# Patient Record
Sex: Male | Born: 1980 | Race: White | Hispanic: No | Marital: Single | Smoking: Former smoker
Health system: Southern US, Community
[De-identification: ages and names within clinical notes are randomized; demographics above are authoritative.]

---

## 2001-04-01 ENCOUNTER — Emergency Department (HOSPITAL_COMMUNITY): Admission: EM | Admit: 2001-04-01 | Discharge: 2001-04-01 | Payer: Self-pay | Admitting: Emergency Medicine

## 2004-06-30 ENCOUNTER — Inpatient Hospital Stay (HOSPITAL_COMMUNITY): Admission: EM | Admit: 2004-06-30 | Discharge: 2004-07-02 | Payer: Self-pay

## 2005-09-05 IMAGING — CR DG CHEST 2V
2 series · 2 of 2 positions shown · non-contrast
Comparison: none

Clinical data at 23-year-old with altered mental status

2 view chest
Comparison 07/01/2004. Lungs are mildly hyperinflated. The cardiomediastinal silhouette is within
normal limits. Lungs are free of focal consolidations and pleural effusions.

[view not recorded (1 of 2)]
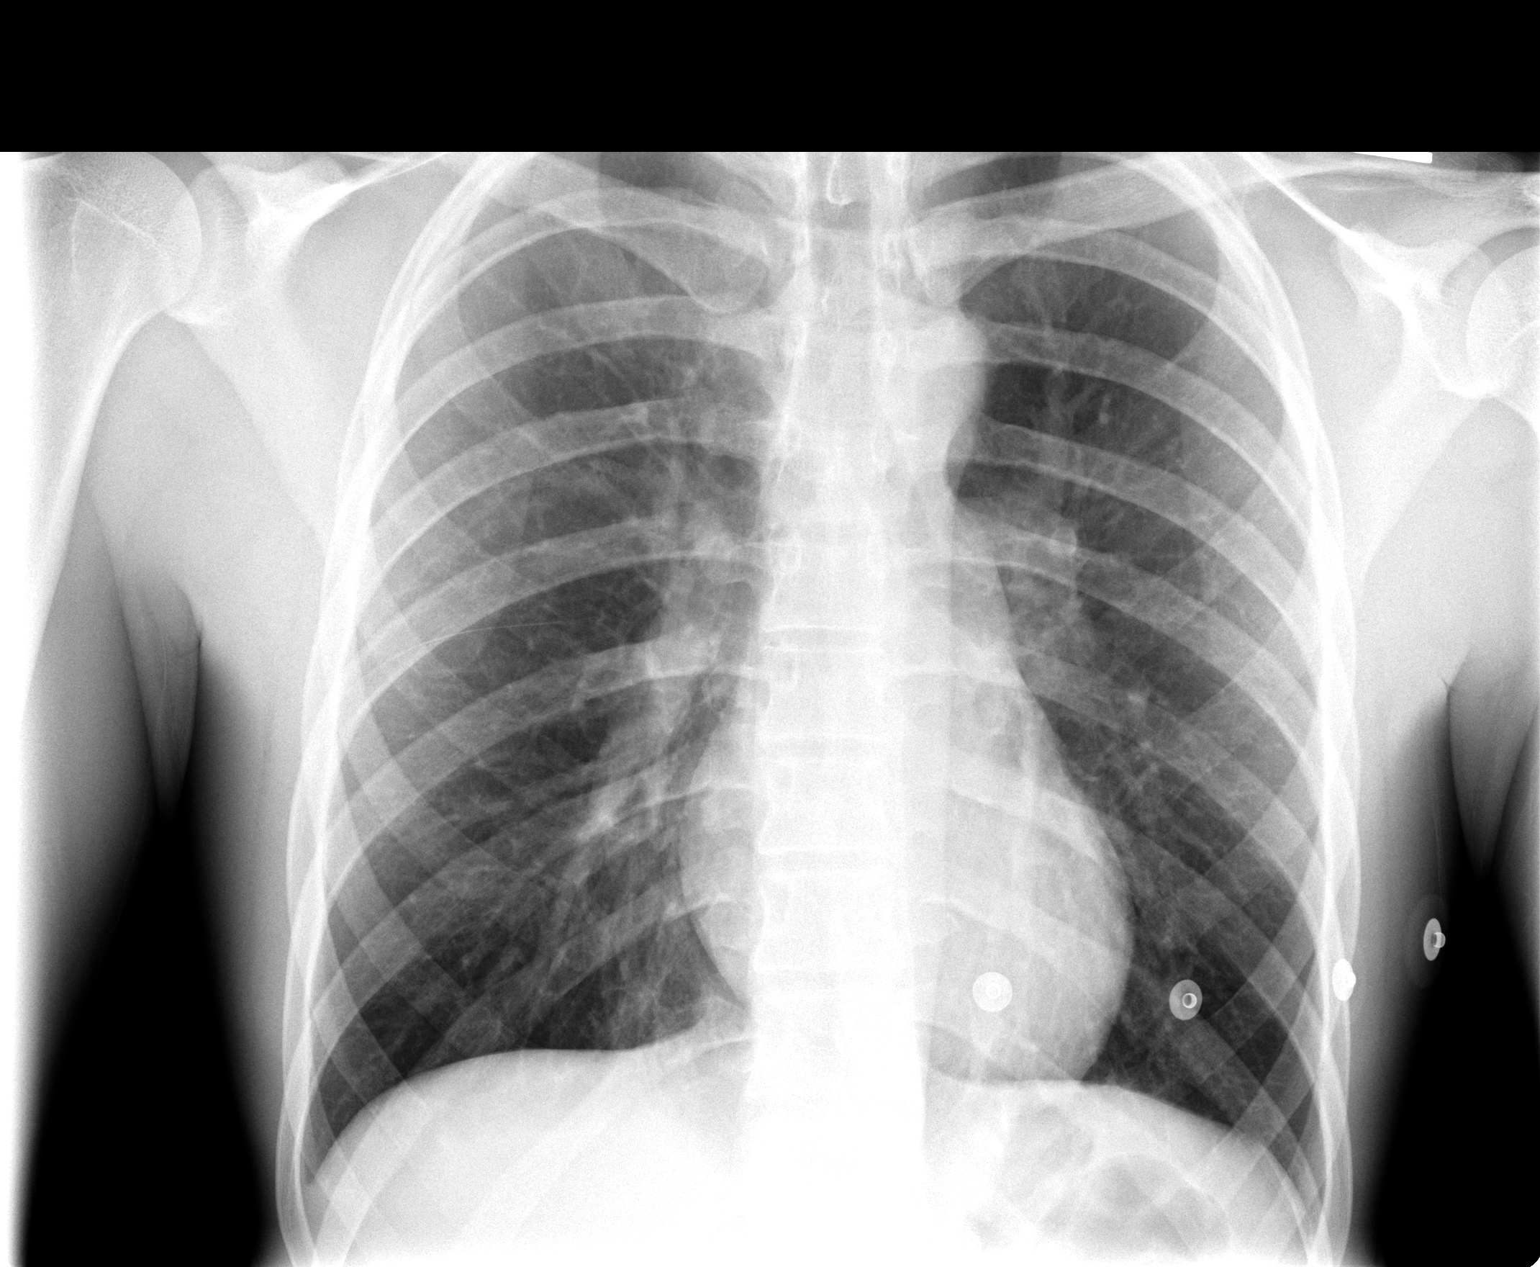

[view not recorded (2 of 2)]
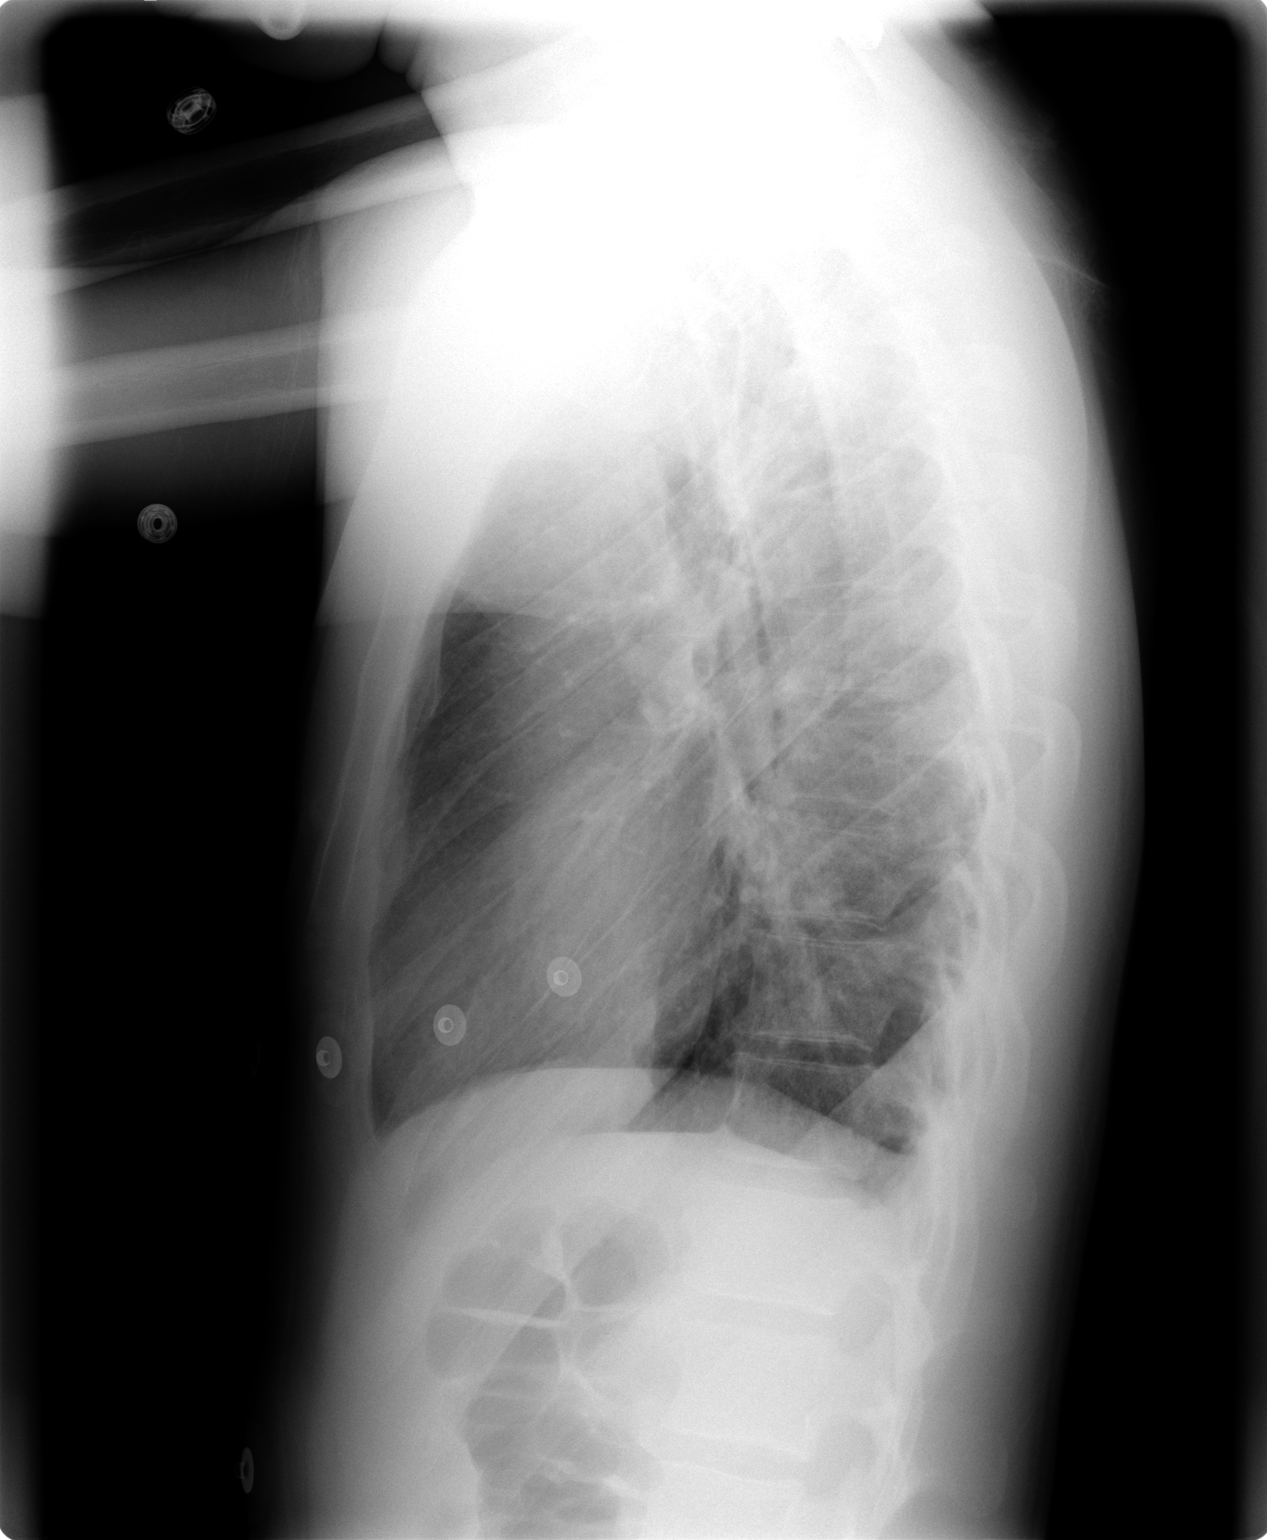

[2 of 2 positions shown; findings below may reference images not displayed]

IMPRESSION: No evidence for acute cardiopulmonary disease.

## 2006-01-19 ENCOUNTER — Emergency Department (HOSPITAL_COMMUNITY): Admission: EM | Admit: 2006-01-19 | Discharge: 2006-01-19 | Payer: Self-pay | Admitting: Emergency Medicine

## 2007-11-26 ENCOUNTER — Emergency Department (HOSPITAL_COMMUNITY): Admission: EM | Admit: 2007-11-26 | Discharge: 2007-11-26 | Payer: Self-pay | Admitting: Emergency Medicine
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
## 2011-06-28 LAB — GC/CHLAMYDIA PROBE AMP, GENITAL: Chlamydia, DNA Probe: NEGATIVE

## 2017-02-26 ENCOUNTER — Ambulatory Visit
Admission: EM | Admit: 2017-02-26 | Discharge: 2017-02-26 | Disposition: A | Discharge status: Home / Self Care | Payer: 59 | Attending: Family Medicine | Admitting: Family Medicine

## 2017-02-26 ENCOUNTER — Encounter: Payer: Self-pay | Admitting: *Deleted

## 2017-02-26 DIAGNOSIS — R300 Dysuria: Secondary | ICD-10-CM | POA: Diagnosis not present

## 2017-02-26 DIAGNOSIS — M545 Low back pain, unspecified: Secondary | ICD-10-CM

## 2017-02-26 LAB — URINALYSIS, COMPLETE (UACMP) WITH MICROSCOPIC
Bilirubin Urine: NEGATIVE
GLUCOSE, UA: 100 mg/dL — AB
Hgb urine dipstick: NEGATIVE
NITRITE: NEGATIVE
PH: 7 (ref 5.0–8.0)
Protein, ur: NEGATIVE mg/dL
Specific Gravity, Urine: 1.02 (ref 1.005–1.030)
Squamous Epithelial / LPF: NONE SEEN

## 2017-02-26 LAB — CHLAMYDIA/NGC RT PCR (ARMC ONLY)
Chlamydia Tr: NOT DETECTED
N GONORRHOEAE: NOT DETECTED

## 2017-02-26 MED ORDER — CYCLOBENZAPRINE HCL 10 MG PO TABS: 10.0000 mg | ORAL_TABLET | Freq: Two times a day (BID) | ORAL | 0 refills | Status: DC | PRN

## 2017-02-26 MED ORDER — MELOXICAM 15 MG PO TABS: 15.0000 mg | ORAL_TABLET | Freq: Every day | ORAL | 0 refills | Status: DC | PRN

## 2017-02-26 MED ORDER — SULFAMETHOXAZOLE-TRIMETHOPRIM 800-160 MG PO TABS: 1.0000 | ORAL_TABLET | Freq: Two times a day (BID) | ORAL | 0 refills | Status: AC

## 2017-02-26 NOTE — ED Triage Notes (Signed)
Low left back pain, onset while weight lifting 2 weeks ago. Has been using Aleve with partial relief.

## 2017-02-26 NOTE — ED Provider Notes (Signed)
[facility] ____________________________________________  Time seen: Approximately 5:25 PM  I have reviewed the triage vital signs and the nursing notes.   HISTORY  Chief Complaint Back Pain   HPI Clinton Brown is a 36 y.o. male present for evaluation of left lower back pain that is in present for the last 2 weeks. Patient reports pain onset was during sit-ups. Patient reports that he frequently works out and lifts weight. Patient states after he worked at [employer] sit ups and had onset of the same pain. Patient reports pain has been the same since onset. Patient reports pain is not constant in the last few days and sometimes goes away. Patient does report pain is worse with movement but at times present at rest. Denies any pain radiation, paresthesias or other injury. Denies any fall or direct trauma. Patient reports that day after pain he had one episode of some discomfort with urination but not a definite burning. Denies any urinary flow changes or hasn't. Patient reports after that episode it went away and has not reoccurred.  States pain is mild at this time. denies any penile or testicular pain or discharge or swelling or rash. Denies any concerns of STDs. Patient reports sexually active with 1 partner. Patient reports he is continue to remain active and working out. Denies history of chronic back pain. Denies any history of kidney stones or renal issues. Again denies direct trauma. Patient reports he has been taking over-the-counter ibuprofen which does help and intermittently does fully resolve pain.  Denies chest pain, shortness of breath, abdominal pain, extremity pain, extremity swelling or rash. Denies recent sickness. Denies recent antibiotic use.    History reviewed. No pertinent past medical history.  There are no active problems to display for this patient.   History reviewed. No pertinent surgical history.   No current facility-administered medications for  this encounter.   Current Outpatient Prescriptions:  .  cyclobenzaprine (FLEXERIL) 10 MG tablet, Take 1 tablet (10 mg total) by mouth 2 (two) times daily as needed for muscle spasms. Do not drive as can cause drowsiness, Disp: 20 tablet, Rfl: 0 .  meloxicam (MOBIC) 15 MG tablet, Take 1 tablet (15 mg total) by mouth daily as needed., Disp: 10 tablet, Rfl: 0 .  sulfamethoxazole-trimethoprim (BACTRIM DS,SEPTRA DS) 800-160 MG tablet, Take 1 tablet by mouth 2 (two) times daily., Disp: 6 tablet, Rfl: 0  Allergies Patient has no known allergies.  History reviewed. No pertinent family history.  Social History Social History  Substance Use Topics  . Smoking status: Former Research scientist (life sciences)  . Smokeless tobacco: Current User    Types: Snuff  . Alcohol use Yes    Review of Systems Constitutional: No fever/chills Cardiovascular: Denies chest pain. Respiratory: Denies shortness of breath. Gastrointestinal: No abdominal pain.  No nausea, no vomiting.  No diarrhea.  No constipation. Genitourinary: As above.  Musculoskeletal: Positive for back pain. Skin: Negative for rash.  ____________________________________________   PHYSICAL EXAM:  VITAL SIGNS: ED Triage Vitals  Enc Vitals Group     BP 02/26/17 1618 118/69     Pulse Rate 02/26/17 1618 78     Resp 02/26/17 1618 16     Temp 02/26/17 1618 98.6 F (37 C)     Temp Source 02/26/17 1618 Oral     SpO2 02/26/17 1618 99 %     Weight 02/26/17 1620 200 lb (90.7 kg)     Height 02/26/17 1620 5\' 11"  (1.803 m)     Head Circumference --  Peak Flow --      Pain Score --      Pain Loc --      Pain Edu? --      Excl. in [place]? --     Constitutional: Alert and oriented. Well appearing and in no acute distress. ENT      Head: Normocephalic and atraumatic. Cardiovascular: Normal rate, regular rhythm. Grossly normal heart sounds.  Good peripheral circulation. Respiratory: Normal respiratory effort without tachypnea nor retractions. Breath sounds are  clear and equal bilaterally. No wheezes, rales, rhonchi. Gastrointestinal: Soft and nontender. No distention. Normal Bowel sounds. No CVA tenderness. Musculoskeletal:  Nontender with normal range of motion in all extremities. No midline cervical, thoracic or lumbar tenderness to palpation. Bilateral pedal pulses equal and easily palpated.  except left upper to mid para lumbar minimal tenderness to direct palpation, pain does increase with lumbar flexion and extension, no pain with lumbar rotation, no midline tenderness, no swelling, no ecchymosis, no erythema, full range of motion present, no saddle anesthesia, no pain with standing bilateral knee lifts, changes positions from sitting to ambulating quickly without distress noted. Neurologic:  Normal speech and language. No gross focal neurologic deficits are appreciated. Speech is normal. No gait instability.  Skin:  Skin is warm, dry and intact. No rash noted. Psychiatric: Mood and affect are normal. Speech and behavior are normal. Patient exhibits appropriate insight and judgment   ___________________________________________   LABS (all labs ordered are listed, but only abnormal results are displayed)  Labs Reviewed  URINALYSIS, COMPLETE (UACMP) WITH MICROSCOPIC - Abnormal; Notable for the following:       Result Value   APPearance CLOUDY (*)    Glucose, UA 100 (*)    Ketones, ur TRACE (*)    Leukocytes, UA SMALL (*)    Bacteria, UA RARE (*)    All other components within normal limits  URINE CULTURE  CHLAMYDIA/NGC RT PCR ([facility] ONLY)     PROCEDURES Procedures    INITIAL IMPRESSION / ASSESSMENT AND PLAN / ED COURSE  Pertinent labs & imaging results that were available during my care of the patient were reviewed by me and considered in my medical decision making (see chart for details).  Well-appearing patient. No acute distress. No focal neurological deficits. No midline tenderness. No direct trauma or fall. Pain worse  with movement, suspect muscular strain injury. Patient did report that he had single episodes of some dysuria, will evaluate urinalysis patient patient declines concerns of STDs. However in reviewing urinalysis no RBCs and hemoglobin noted in, discussed the patient doubt nephrolithiasis as cause of pain. However discuss concern for infection. Discussed and patient agreed to evaluating urine for gonorrhea and chlamydia. Will culture urine, will empirically treat patient with Bactrim 3 days. Will also start patient on oral Mobic and when necessary Flexeril. Encourage rest, fluids, supportive care, stretching and follow-up as needed for continued pain.Discussed indication, risks and benefits of medications with patient.  Discussed follow up with Primary care physician this week. Discussed follow up and return parameters including no resolution or any worsening concerns. Patient verbalized understanding and agreed to plan.   ____________________________________________   FINAL CLINICAL IMPRESSION(S) / ED DIAGNOSES  Final diagnoses:  Acute left-sided low back pain without sciatica  Dysuria     Discharge Medication List as of 02/26/2017  6:16 PM    START taking these medications   Details  cyclobenzaprine (FLEXERIL) 10 MG tablet Take 1 tablet (10 mg total) by mouth 2 (two) times daily  as needed for muscle spasms. Do not drive as can cause drowsiness, Starting Wed 02/26/2017, Normal    meloxicam (MOBIC) 15 MG tablet Take 1 tablet (15 mg total) by mouth daily as needed., Starting Wed 02/26/2017, Normal    sulfamethoxazole-trimethoprim (BACTRIM DS,SEPTRA DS) 800-160 MG tablet Take 1 tablet by mouth 2 (two) times daily., Starting Wed 02/26/2017, Until Sat 03/01/2017, Normal        Note: This dictation was prepared with Dragon dictation along with smaller phrase technology. Any transcriptional errors that result from this process are unintentional.         Marylene Land, NP 02/26/17 2131

## 2017-02-26 NOTE — Discharge Instructions (Signed)
Take medication as prescribed. Rest. Drink plenty of fluids.  ° °Follow up with your primary care physician this week as needed. Return to Urgent care for new or worsening concerns.  ° °

## 2017-03-01 LAB — URINE CULTURE: Culture: >=100000 — AB

## 2017-12-02 ENCOUNTER — Other Ambulatory Visit: Payer: Self-pay

## 2017-12-02 ENCOUNTER — Ambulatory Visit
Admission: EM | Admit: 2017-12-02 | Discharge: 2017-12-02 | Disposition: A | Discharge status: Home / Self Care | Payer: 59 | Attending: Family Medicine | Admitting: Family Medicine

## 2017-12-02 ENCOUNTER — Encounter: Payer: Self-pay | Admitting: Emergency Medicine

## 2017-12-02 DIAGNOSIS — J111 Influenza due to unidentified influenza virus with other respiratory manifestations: Secondary | ICD-10-CM

## 2017-12-02 DIAGNOSIS — R509 Fever, unspecified: Secondary | ICD-10-CM

## 2017-12-02 DIAGNOSIS — R51 Headache: Secondary | ICD-10-CM | POA: Diagnosis not present

## 2017-12-02 DIAGNOSIS — R05 Cough: Secondary | ICD-10-CM | POA: Diagnosis not present

## 2017-12-02 DIAGNOSIS — R69 Illness, unspecified: Secondary | ICD-10-CM | POA: Diagnosis not present

## 2017-12-02 MED ORDER — OSELTAMIVIR PHOSPHATE 75 MG PO CAPS: 75.0000 mg | ORAL_CAPSULE | Freq: Two times a day (BID) | ORAL | 0 refills | Status: DC

## 2017-12-02 NOTE — ED Triage Notes (Signed)
Patient c/o HAs, chills, and fever and bodyaches that started yesterday.  Patient states that his daughter was recently treated for RSV.

## 2017-12-02 NOTE — ED Provider Notes (Signed)
[facility]  CSN: [id number] Arrival date & time: 12/02/17  1149  History   Chief Complaint Chief Complaint  Patient presents with  . Fever  . Headache   HPI  37 year old male presents with the above complaints.  Patient states that he been sick since yesterday.  Has had fevers, chills, headache, body aches, cough.  Symptoms are severe.  Recently exposed to RSV.  No known exacerbating relieving factors.  No medications tried.  No other associated symptoms.  No other complaints at this time.  Social History Social History   Tobacco Use  . Smoking status: Former Research scientist (life sciences)  . Smokeless tobacco: Current User    Types: Snuff  Substance Use Topics  . Alcohol use: Yes  . Drug use: No   Allergies   Patient has no known allergies.   Review of Systems Review of Systems  Constitutional: Positive for chills and fever.  Respiratory: Positive for cough.   Musculoskeletal:       Body aches.  Neurological: Positive for headaches.   Physical Exam Triage Vital Signs ED Triage Vitals  Enc Vitals Group     BP 12/02/17 1200 107/64     Pulse Rate 12/02/17 1200 93     Resp 12/02/17 1200 16     Temp 12/02/17 1200 99 F (37.2 C)     Temp Source 12/02/17 1200 Oral     SpO2 12/02/17 1200 98 %     Weight 12/02/17 1158 195 lb (88.5 kg)     Height 12/02/17 1158 5\' 11"  (1.803 m)     Head Circumference --      Peak Flow --      Pain Score 12/02/17 1158 5     Pain Loc --      Pain Edu? --      Excl. in [place]? --    Updated Vital Signs BP 107/64 (BP Location: Left Arm)   Pulse 93   Temp 99 F (37.2 C) (Oral)   Resp 16   Ht 5\' 11"  (1.803 m)   Wt 195 lb (88.5 kg)   SpO2 98%   BMI 27.20 kg/m   Physical Exam  Constitutional: He is oriented to person, place, and time. He appears well-developed. No distress.  HENT:  Head: Normocephalic and atraumatic.  Oropharynx with moderate erythema.  No exudate.  Eyes: Conjunctivae are normal. Right eye exhibits no discharge. Left eye  exhibits no discharge.  Cardiovascular: Normal rate and regular rhythm.  Pulmonary/Chest: Effort normal and breath sounds normal. He has no wheezes. He has no rales.  Neurological: He is alert and oriented to person, place, and time.  Psychiatric: He has a normal mood and affect. His behavior is normal.  Nursing note and vitals reviewed.  UC Treatments / Results  Labs (all labs ordered are listed, but only abnormal results are displayed) Labs Reviewed - No data to display  EKG  EKG Interpretation None       Radiology No results found.  Procedures Procedures (including critical care time)  Medications Ordered in UC Medications - No data to display   Initial Impression / Assessment and Plan / UC Course  I have reviewed the triage vital signs and the nursing notes.  Pertinent labs & imaging results that were available during my care of the patient were reviewed by me and considered in my medical decision making (see chart for details).     37 year old male presents with suspected influenza.  Treating with Tamiflu.  Work note  given.  Final Clinical Impressions(s) / UC Diagnoses   Final diagnoses:  Influenza-like illness    ED Discharge Orders        Ordered    oseltamivir (TAMIFLU) 75 MG capsule  Every 12 hours     12/02/17 1228     Controlled Substance Prescriptions [place] Controlled Substance Registry consulted? Not Applicable   Coral Spikes, DO 12/02/17 1302

## 2019-11-15 ENCOUNTER — Other Ambulatory Visit: Payer: Self-pay | Admitting: Family Medicine

## 2019-11-15 DIAGNOSIS — M25561 Pain in right knee: Secondary | ICD-10-CM

## 2019-11-15 DIAGNOSIS — S86911A Strain of unspecified muscle(s) and tendon(s) at lower leg level, right leg, initial encounter: Secondary | ICD-10-CM

## 2019-11-16 ENCOUNTER — Other Ambulatory Visit: Payer: 59

## 2019-11-17 ENCOUNTER — Ambulatory Visit
Admission: RE | Admit: 2019-11-17 | Discharge: 2019-11-17 | Disposition: A | Discharge status: Home / Self Care | Payer: No Typology Code available for payment source | Source: Ambulatory Visit | Attending: Family Medicine | Admitting: Family Medicine

## 2019-11-17 ENCOUNTER — Other Ambulatory Visit: Payer: Self-pay

## 2019-11-17 DIAGNOSIS — S86911A Strain of unspecified muscle(s) and tendon(s) at lower leg level, right leg, initial encounter: Secondary | ICD-10-CM

## 2021-01-20 IMAGING — MR MR KNEE*R* W/O CM
4 of 6 series · 22 of 40 positions shown · non-contrast
Comparison: Radiographs 11/11/2019

CLINICAL DATA: Right knee pain persisting after exercise 1 week
ago.

EXAM:
MRI OF THE RIGHT KNEE WITHOUT CONTRAST
TECHNIQUE: Multiplanar, multisequence MR imaging of the knee was performed. No
intravenous contrast was administered.

[Series 3: T2 fat-sat · axial · 4.0mm · 0.50mm/px · z∈[-65,+34]mm · 5 of 26 slices shown (1 of 2)]
[im 1/26]
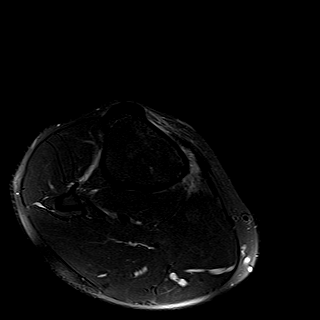
[im 5/26]
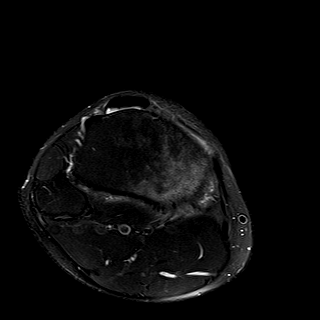
[im 9/26]
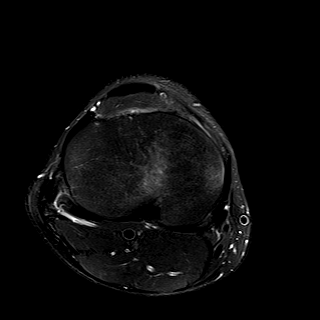
[im 13/26]
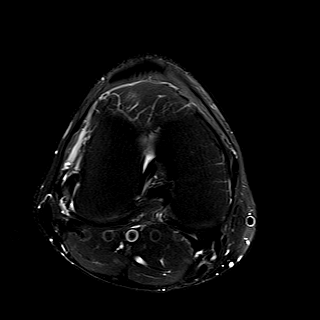
[im 21/26]
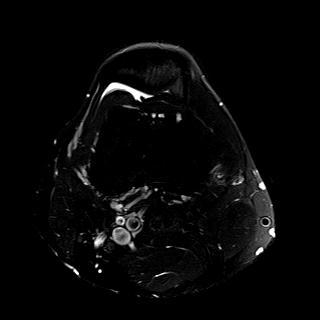

[Series 5: T2 fat-sat · coronal · 4.0mm · 0.29mm/px · 3 of 24 slices shown (2 of 2)]
[im 5/24]
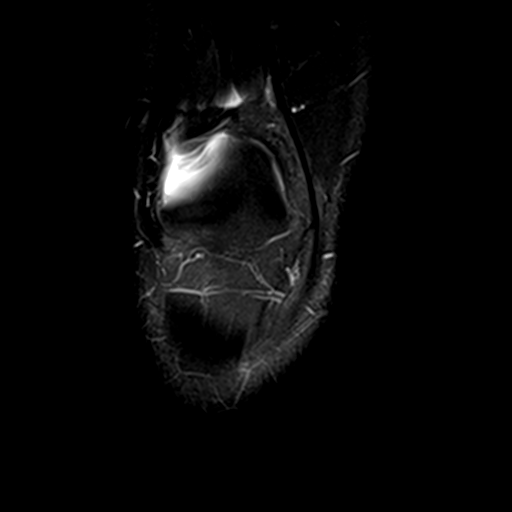
[im 14/24]
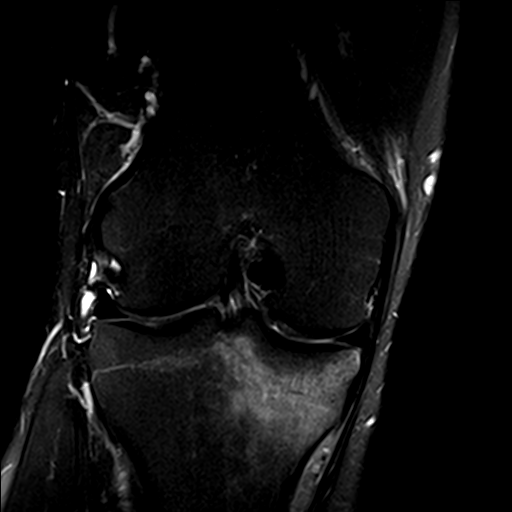
[im 24/24]
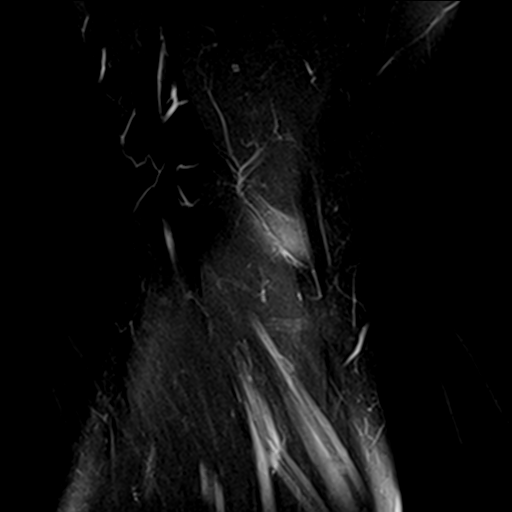

[Series 7: PD fat-sat · sagittal · 3.3mm · 0.29mm/px · 7 of 27 slices shown (1 of 2)]
[im 1/27]
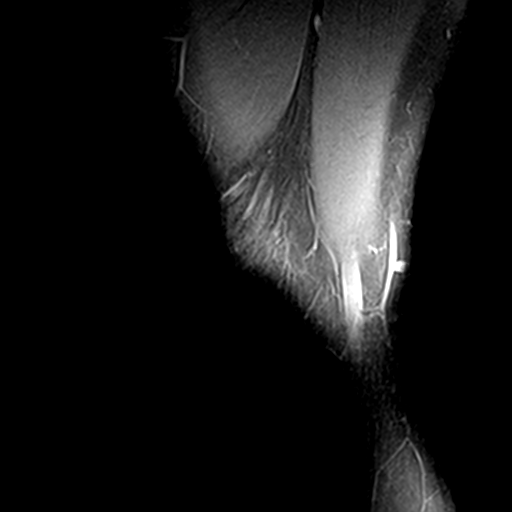
[im 5/27]
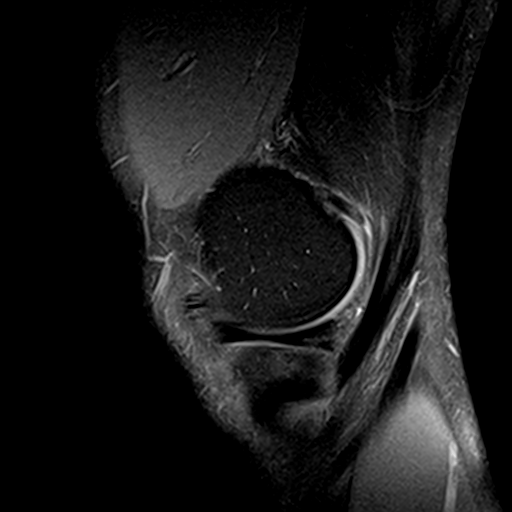
[im 9/27]
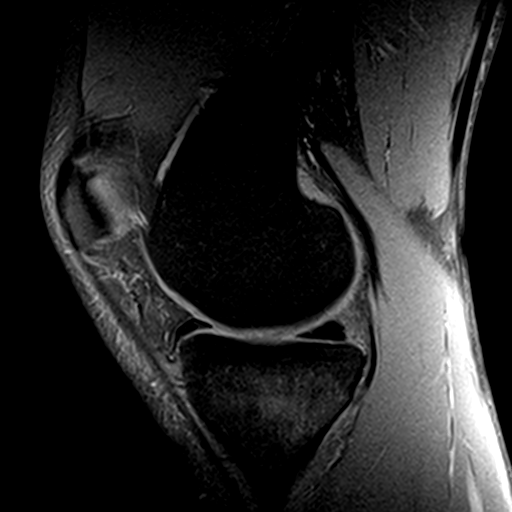
[im 14/27]
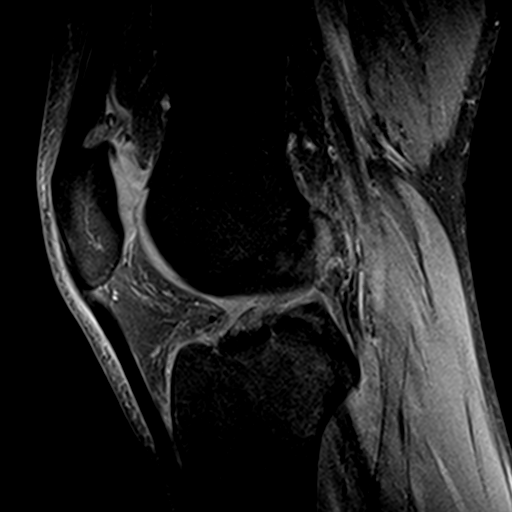
[im 18/27]
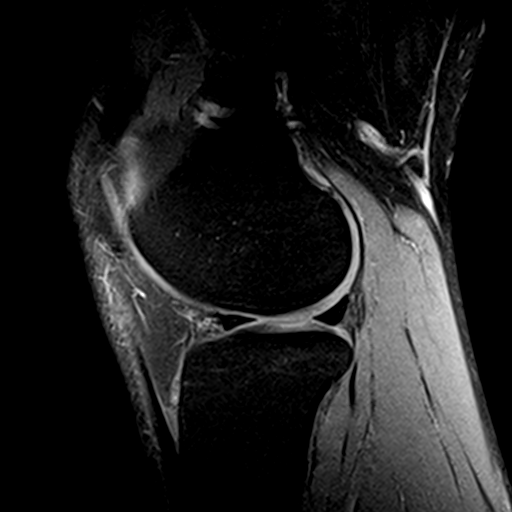
[im 22/27]
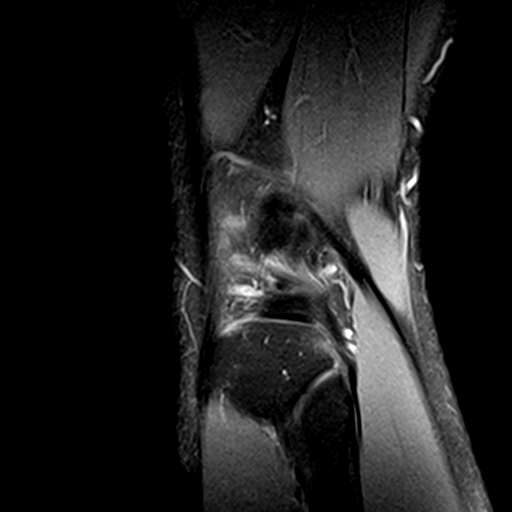
[im 27/27]
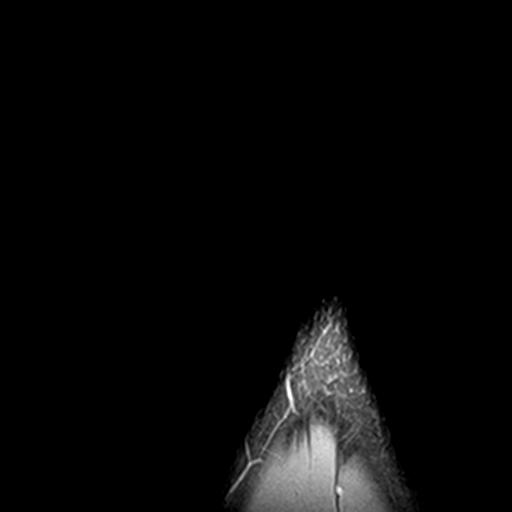

[Series 8: PD fat-sat · coronal · 3.3mm · 0.29mm/px · 7 of 28 slices shown (2 of 2)]
[im 1/28]
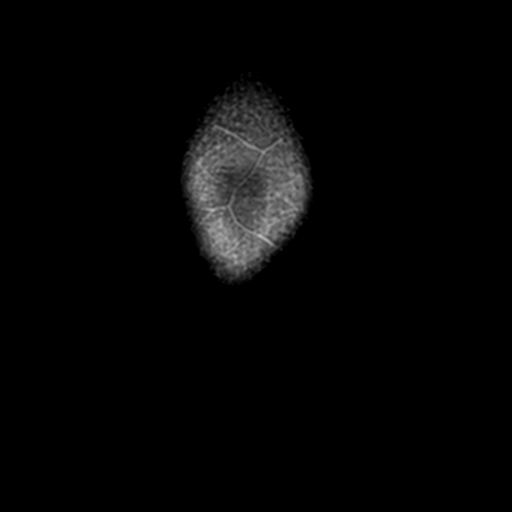
[im 5/28]
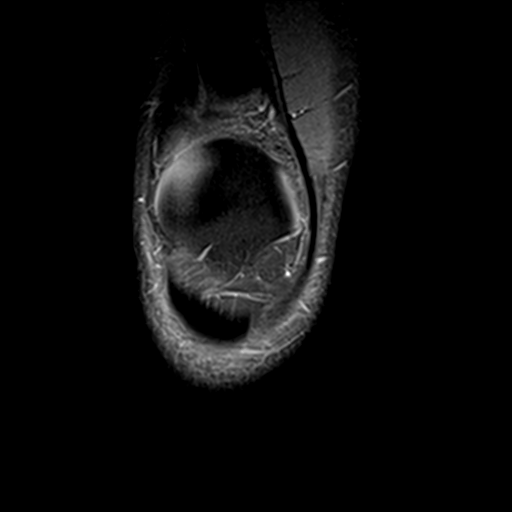
[im 10/28]
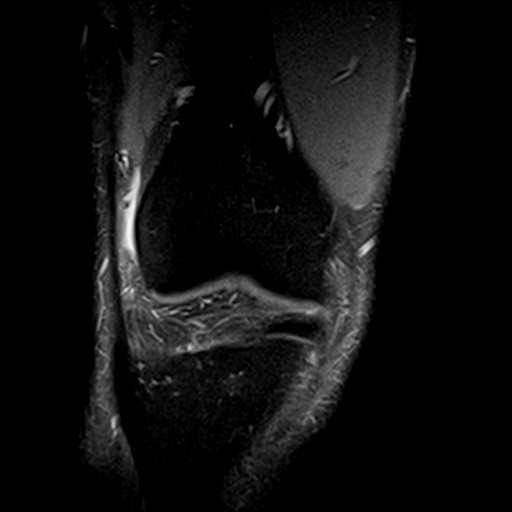
[im 14/28]
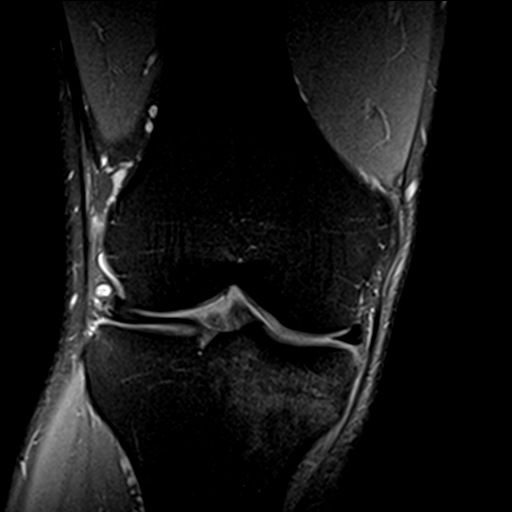
[im 19/28]
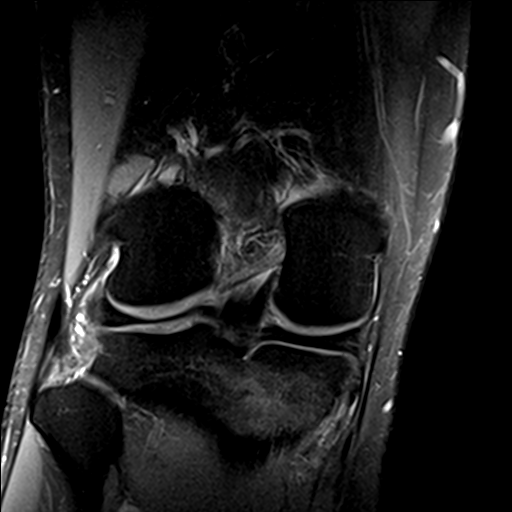
[im 23/28]
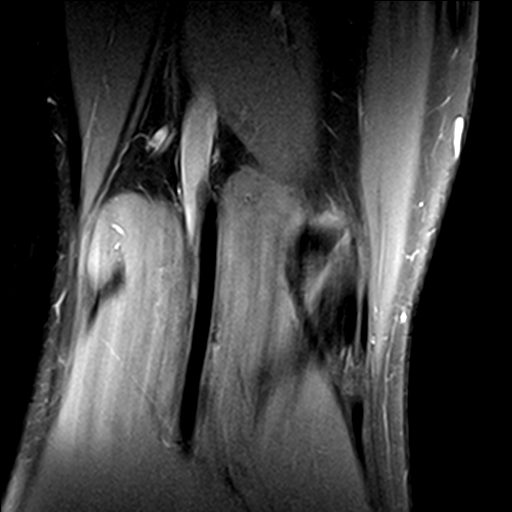
[im 28/28]
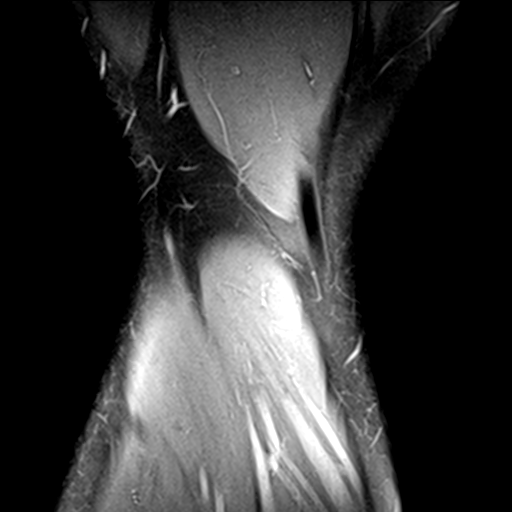

[22 of 40 positions shown; findings below may reference images not displayed]

FINDINGS: MENISCI

Medial meniscus:  Unremarkable

Lateral meniscus:  Unremarkable

LIGAMENTS

Cruciates:  Unremarkable

Collaterals:  Unremarkable

CARTILAGE

Patellofemoral:  Unremarkable

Medial:  Unremarkable

Lateral: Equivocal partial thickness focal chondral thinning
posteriorly along the lateral femoral condyle measuring about 0.7 by
0.6 cm, image [DATE].

Joint: Upper normal amount of fluid in the knee joint. Mildly
thickened medial plica, images 8-9 series 3.

Popliteal Fossa:  Very small Baker's cyst.

Extensor Mechanism:  Unremarkable

Bones: Mild stress fracture of the medial tibial metaphysis
proximally, for example on images 10 through 12 of series 4, with
surrounding marrow edema.

Other: No supplemental non-categorized findings.
IMPRESSION: 1. Mild stress fracture of the medial tibial metaphysis proximally,
with surrounding marrow edema.
2. Mildly thickened medial plica.
3. Very small Baker's cyst.
4. Equivocal partial thickness focal chondral thinning posteriorly
along the lateral femoral condyle.

## 2021-06-28 ENCOUNTER — Ambulatory Visit: Payer: No Typology Code available for payment source | Admitting: Dermatology

## 2021-09-26 ENCOUNTER — Ambulatory Visit: Payer: No Typology Code available for payment source | Admitting: Dermatology

## 2021-11-19 ENCOUNTER — Ambulatory Visit (INDEPENDENT_AMBULATORY_CARE_PROVIDER_SITE_OTHER): Payer: BC Managed Care – PPO | Admitting: Dermatology

## 2021-11-19 ENCOUNTER — Encounter: Payer: Self-pay | Admitting: Dermatology

## 2021-11-19 ENCOUNTER — Other Ambulatory Visit: Payer: Self-pay

## 2021-11-19 DIAGNOSIS — D1801 Hemangioma of skin and subcutaneous tissue: Secondary | ICD-10-CM

## 2021-11-19 DIAGNOSIS — L720 Epidermal cyst: Secondary | ICD-10-CM

## 2021-11-19 NOTE — Progress Notes (Signed)
° °  New Patient   Subjective  Clinton Brown is a 41 y.o. male who presents for the following: Cyst (Back of neck x years- stays same size).  Cyst on back of neck, also okay with skin check. Location:  Duration:  Quality:  Associated Signs/Symptoms: Modifying Factors:  Severity:  Timing: Context:    The following portions of the chart were reviewed this encounter and updated as appropriate:  Tobacco   Allergies   Meds   Problems   Med Hx   Surg Hx   Fam Hx       Objective  Well appearing patient in no apparent distress; mood and affect are within normal limits. Neck - Posterior 2+ centimeter noninflamed ovoid deep dermal to subcutaneous nodule lower right central posterior neck.  Discussed the diagnosis in detail including the small possibility of growth or inflammation.  I told the patient that the risks of surgery would include recurrence, unsightly scar, infection, and noncoverage by insurance.  Right Upper Back 2 mm smooth red dermal papule    All skin waist up examined.   Assessment & Plan  Epidermal cyst Neck - Posterior  Patient will check with his insurance plan relating to coverage of surgery.  He understands to schedule as the last morning or afternoon surgery if he wants this removed and that outside stitches come out within 2 weeks and he might need to modified his intensive workout schedule and heavy lifting at work.  Cherry angioma Right Upper Back  No intervention necessary
